# Patient Record
Sex: Female | Born: 2000
Health system: Southern US, Community
[De-identification: ages and names within clinical notes are randomized; demographics above are authoritative.]

---
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
## 2022-01-29 IMAGING — DX DG FOOT 2V*L*
2 series · 2 of 2 positions shown · non-contrast
Comparison: None.

CLINICAL DATA: Stepped on nail last night. Eval for foreign body

EXAM:
LEFT FOOT - 2 VIEW

[foot ap]
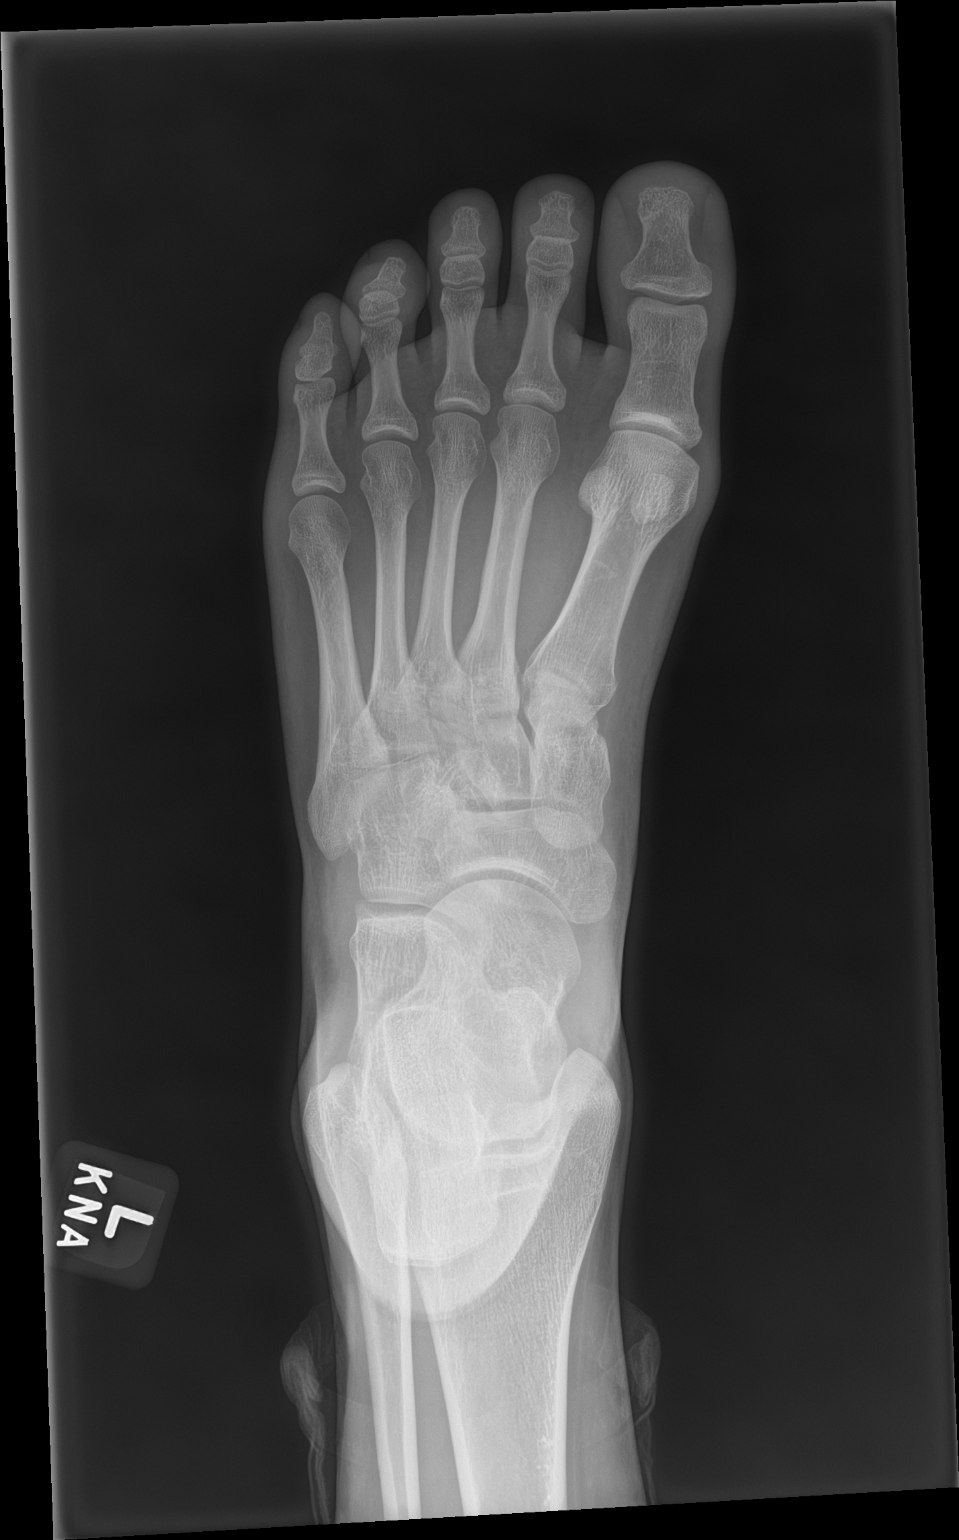

[foot lat]
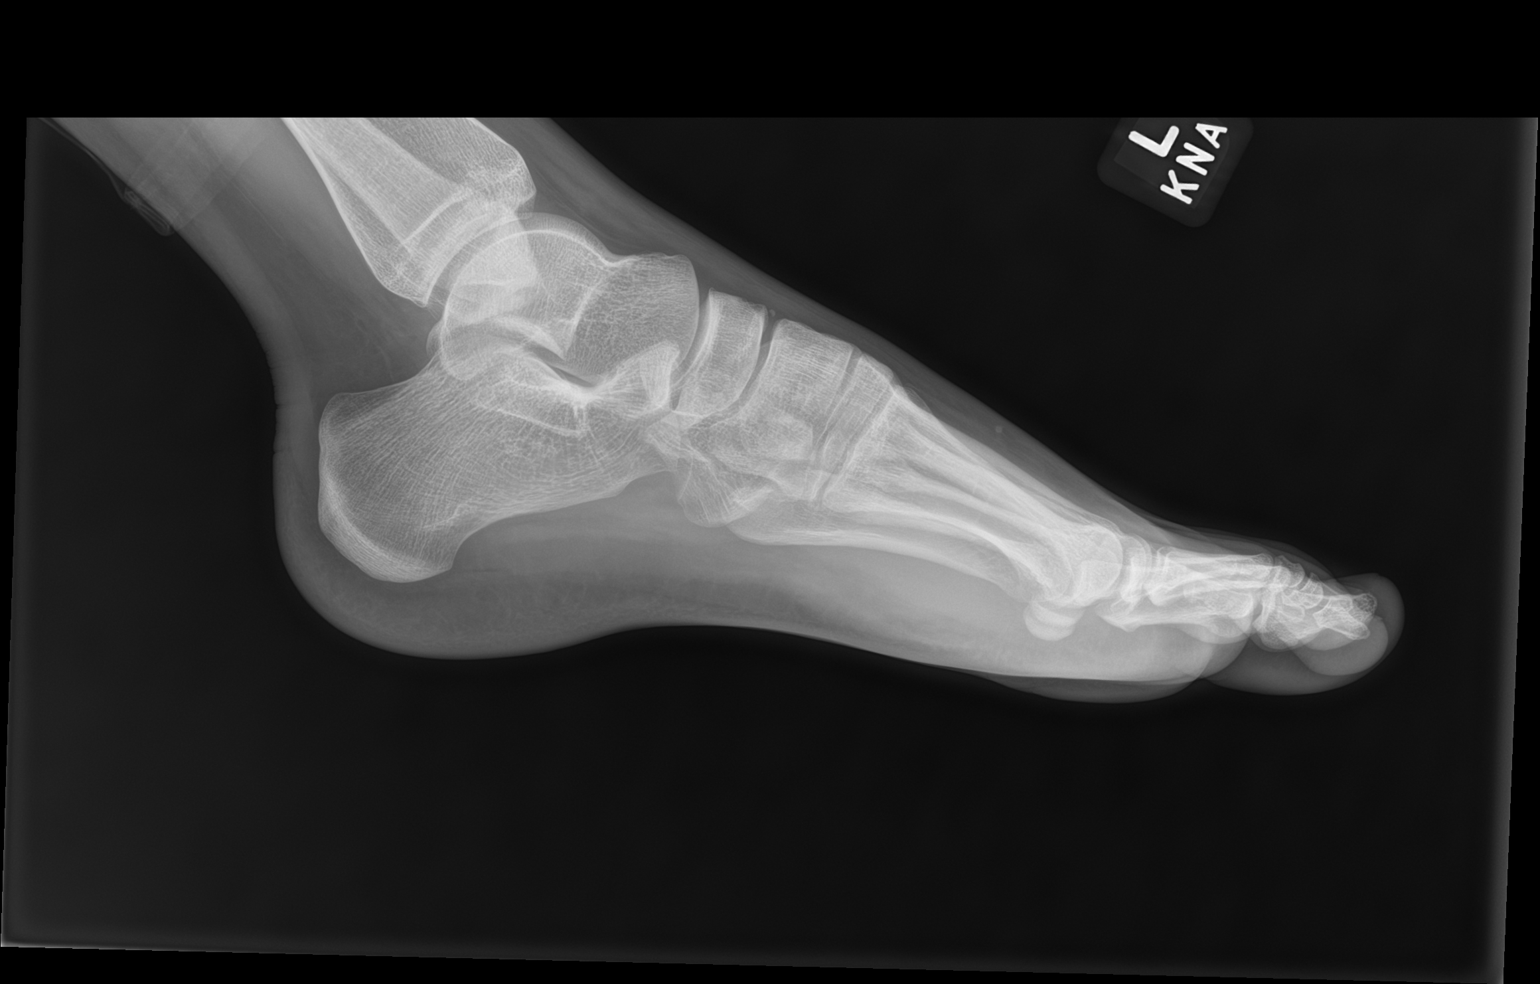

[2 of 2 positions shown; findings below may reference images not displayed]

FINDINGS: There is no evidence of fracture or dislocation. There is no
evidence of arthropathy or other focal bone abnormality. Soft
tissues are unremarkable. No radiopaque foreign body identified.
IMPRESSION: Negative.  No radiopaque foreign body.

## 2022-02-10 ENCOUNTER — Ambulatory Visit: Payer: Self-pay | Admitting: Pediatrics

## 2022-02-10 DIAGNOSIS — Z23 Encounter for immunization: Secondary | ICD-10-CM

## 2022-02-23 DIAGNOSIS — J029 Acute pharyngitis, unspecified: Secondary | ICD-10-CM | POA: Diagnosis not present

## 2022-02-23 DIAGNOSIS — Z681 Body mass index (BMI) 19 or less, adult: Secondary | ICD-10-CM | POA: Diagnosis not present

## 2022-02-23 DIAGNOSIS — J069 Acute upper respiratory infection, unspecified: Secondary | ICD-10-CM | POA: Diagnosis not present

## 2022-07-19 DIAGNOSIS — Z681 Body mass index (BMI) 19 or less, adult: Secondary | ICD-10-CM | POA: Diagnosis not present

## 2022-07-19 DIAGNOSIS — J029 Acute pharyngitis, unspecified: Secondary | ICD-10-CM | POA: Diagnosis not present
